# Patient Record
Sex: Female | Born: 1970 | Race: White | Hispanic: No | Marital: Married | Smoking: Current every day smoker
Health system: Southern US, Community
[De-identification: ages and names within clinical notes are randomized; demographics above are authoritative.]

## PROBLEM LIST (undated history)

## (undated) DIAGNOSIS — I10 Essential (primary) hypertension: Secondary | ICD-10-CM

---

## 2019-06-17 ENCOUNTER — Emergency Department (HOSPITAL_COMMUNITY): Payer: 59

## 2019-06-17 ENCOUNTER — Other Ambulatory Visit: Payer: Self-pay

## 2019-06-17 ENCOUNTER — Observation Stay (HOSPITAL_COMMUNITY)
Admission: EM | Admit: 2019-06-17 | Discharge: 2019-06-18 | Disposition: A | Discharge status: Home / Self Care | Payer: 59 | Attending: General Surgery | Admitting: General Surgery

## 2019-06-17 ENCOUNTER — Encounter (HOSPITAL_COMMUNITY): Payer: Self-pay | Admitting: *Deleted

## 2019-06-17 DIAGNOSIS — K8012 Calculus of gallbladder with acute and chronic cholecystitis without obstruction: Principal | ICD-10-CM | POA: Insufficient documentation

## 2019-06-17 DIAGNOSIS — R1011 Right upper quadrant pain: Secondary | ICD-10-CM

## 2019-06-17 DIAGNOSIS — E039 Hypothyroidism, unspecified: Secondary | ICD-10-CM | POA: Diagnosis not present

## 2019-06-17 DIAGNOSIS — K8020 Calculus of gallbladder without cholecystitis without obstruction: Secondary | ICD-10-CM | POA: Diagnosis present

## 2019-06-17 DIAGNOSIS — G4733 Obstructive sleep apnea (adult) (pediatric): Secondary | ICD-10-CM | POA: Diagnosis not present

## 2019-06-17 DIAGNOSIS — F17200 Nicotine dependence, unspecified, uncomplicated: Secondary | ICD-10-CM | POA: Diagnosis not present

## 2019-06-17 DIAGNOSIS — Z20822 Contact with and (suspected) exposure to covid-19: Secondary | ICD-10-CM | POA: Diagnosis not present

## 2019-06-17 DIAGNOSIS — E876 Hypokalemia: Secondary | ICD-10-CM | POA: Diagnosis not present

## 2019-06-17 DIAGNOSIS — I10 Essential (primary) hypertension: Secondary | ICD-10-CM | POA: Diagnosis not present

## 2019-06-17 HISTORY — DX: Essential (primary) hypertension: I10

## 2019-06-17 LAB — COMPREHENSIVE METABOLIC PANEL
ALT: 33 U/L (ref 0–44)
AST: 21 U/L (ref 15–41)
Albumin: 4.7 g/dL (ref 3.5–5.0)
Alkaline Phosphatase: 48 U/L (ref 38–126)
Anion gap: 16 — ABNORMAL HIGH (ref 5–15)
BUN: 15 mg/dL (ref 6–20)
CO2: 26 mmol/L (ref 22–32)
Calcium: 9.2 mg/dL (ref 8.9–10.3)
Chloride: 98 mmol/L (ref 98–111)
Creatinine, Ser: 0.84 mg/dL (ref 0.44–1.00)
GFR calc Af Amer: >60 mL/min (ref >60)
GFR calc non Af Amer: >60 mL/min (ref >60)
Glucose, Bld: 166 mg/dL — ABNORMAL HIGH (ref 70–99)
Potassium: 2.6 mmol/L — CL (ref 3.5–5.1)
Sodium: 140 mmol/L (ref 135–145)
Total Bilirubin: 0.5 mg/dL (ref 0.3–1.2)
Total Protein: 7.7 g/dL (ref 6.5–8.1)

## 2019-06-17 LAB — CBC WITH DIFFERENTIAL/PLATELET
Abs Immature Granulocytes: 0.03 10*3/uL (ref 0.00–0.07)
Basophils Absolute: 0.1 10*3/uL (ref 0.0–0.1)
Basophils Relative: 1 %
Eosinophils Absolute: 0.2 10*3/uL (ref 0.0–0.5)
Eosinophils Relative: 2 %
HCT: 44.7 % (ref 36.0–46.0)
Hemoglobin: 15 g/dL (ref 12.0–15.0)
Immature Granulocytes: 0 %
Lymphocytes Relative: 20 %
Lymphs Abs: 1.9 10*3/uL (ref 0.7–4.0)
MCH: 28.3 pg (ref 26.0–34.0)
MCHC: 33.6 g/dL (ref 30.0–36.0)
MCV: 84.3 fL (ref 80.0–100.0)
Monocytes Absolute: 0.5 10*3/uL (ref 0.1–1.0)
Monocytes Relative: 6 %
Neutro Abs: 7 10*3/uL (ref 1.7–7.7)
Neutrophils Relative %: 71 %
Platelets: 226 10*3/uL (ref 150–400)
RBC: 5.3 MIL/uL — ABNORMAL HIGH (ref 3.87–5.11)
RDW: 13.4 % (ref 11.5–15.5)
WBC: 9.8 10*3/uL (ref 4.0–10.5)
nRBC: 0 % (ref 0.0–0.2)

## 2019-06-17 LAB — URINALYSIS, ROUTINE W REFLEX MICROSCOPIC
Bilirubin Urine: NEGATIVE
Glucose, UA: NEGATIVE mg/dL
Hgb urine dipstick: NEGATIVE
Ketones, ur: NEGATIVE mg/dL
Leukocytes,Ua: NEGATIVE
Nitrite: NEGATIVE
Protein, ur: NEGATIVE mg/dL
Specific Gravity, Urine: 1.009 (ref 1.005–1.030)
pH: 8 (ref 5.0–8.0)

## 2019-06-17 LAB — PREGNANCY, URINE: Preg Test, Ur: NEGATIVE

## 2019-06-17 LAB — RESPIRATORY PANEL BY RT PCR (FLU A&B, COVID)
Influenza A by PCR: NEGATIVE
Influenza B by PCR: NEGATIVE
SARS Coronavirus 2 by RT PCR: NEGATIVE

## 2019-06-17 LAB — HIV ANTIBODY (ROUTINE TESTING W REFLEX): HIV Screen 4th Generation wRfx: NONREACTIVE

## 2019-06-17 LAB — MAGNESIUM: Magnesium: 2.1 mg/dL (ref 1.7–2.4)

## 2019-06-17 LAB — LIPASE, BLOOD: Lipase: 33 U/L (ref 11–51)

## 2019-06-17 MED ORDER — HYDROCHLOROTHIAZIDE 25 MG PO TABS: 25.0000 mg | ORAL_TABLET | Freq: Every day | ORAL | Status: DC

## 2019-06-17 MED ORDER — POTASSIUM CHLORIDE CRYS ER 20 MEQ PO TBCR
40.0000 meq | EXTENDED_RELEASE_TABLET | Freq: Two times a day (BID) | ORAL | Status: AC
  Administered 2019-06-17 (×2): 40 meq via ORAL
  Filled 2019-06-17 (×2): qty 2

## 2019-06-17 MED ORDER — POTASSIUM CHLORIDE 10 MEQ/100ML IV SOLN
10.0000 meq | Freq: Once | INTRAVENOUS | Status: AC
  Administered 2019-06-17: 10 meq via INTRAVENOUS
  Filled 2019-06-17: qty 100

## 2019-06-17 MED ORDER — HYDROMORPHONE HCL 1 MG/ML IJ SOLN
1.0000 mg | Freq: Once | INTRAMUSCULAR | Status: AC
  Administered 2019-06-17: 1 mg via INTRAVENOUS
  Filled 2019-06-17: qty 1

## 2019-06-17 MED ORDER — SODIUM CHLORIDE 0.9 % IV SOLN: INTRAVENOUS | Status: DC

## 2019-06-17 MED ORDER — OXYCODONE HCL 5 MG PO TABS: 5.0000 mg | ORAL_TABLET | ORAL | Status: DC | PRN

## 2019-06-17 MED ORDER — PANTOPRAZOLE SODIUM 40 MG IV SOLR
40.0000 mg | Freq: Every day | INTRAVENOUS | Status: DC
  Administered 2019-06-17 – 2019-06-18 (×2): 40 mg via INTRAVENOUS
  Filled 2019-06-17 (×2): qty 40

## 2019-06-17 MED ORDER — POTASSIUM CHLORIDE CRYS ER 20 MEQ PO TBCR: 40.0000 meq | EXTENDED_RELEASE_TABLET | Freq: Two times a day (BID) | ORAL | Status: DC

## 2019-06-17 MED ORDER — AMLODIPINE BESYLATE 5 MG PO TABS: 5.0000 mg | ORAL_TABLET | Freq: Every day | ORAL | Status: DC

## 2019-06-17 MED ORDER — ACETAMINOPHEN 325 MG PO TABS: 650.0000 mg | ORAL_TABLET | Freq: Four times a day (QID) | ORAL | Status: DC | PRN

## 2019-06-17 MED ORDER — FAMOTIDINE IN NACL 20-0.9 MG/50ML-% IV SOLN
20.0000 mg | Freq: Once | INTRAVENOUS | Status: AC
  Administered 2019-06-17: 20 mg via INTRAVENOUS
  Filled 2019-06-17: qty 50

## 2019-06-17 MED ORDER — MORPHINE SULFATE (PF) 2 MG/ML IV SOLN
2.0000 mg | INTRAVENOUS | Status: DC | PRN
  Administered 2019-06-17 – 2019-06-18 (×3): 2 mg via INTRAVENOUS
  Filled 2019-06-17 (×3): qty 1

## 2019-06-17 MED ORDER — DIPHENHYDRAMINE HCL 50 MG/ML IJ SOLN: 25.0000 mg | Freq: Four times a day (QID) | INTRAMUSCULAR | Status: DC | PRN

## 2019-06-17 MED ORDER — METOCLOPRAMIDE HCL 5 MG/ML IJ SOLN
10.0000 mg | Freq: Once | INTRAMUSCULAR | Status: AC
  Administered 2019-06-17: 10 mg via INTRAVENOUS
  Filled 2019-06-17: qty 2

## 2019-06-17 MED ORDER — SERTRALINE HCL 50 MG PO TABS
50.0000 mg | ORAL_TABLET | Freq: Every day | ORAL | Status: DC
  Administered 2019-06-17: 50 mg via ORAL
  Filled 2019-06-17 (×2): qty 1

## 2019-06-17 MED ORDER — ONDANSETRON HCL 4 MG/2ML IJ SOLN
4.0000 mg | Freq: Once | INTRAMUSCULAR | Status: AC
  Administered 2019-06-17: 4 mg via INTRAVENOUS
  Filled 2019-06-17: qty 2

## 2019-06-17 MED ORDER — ONDANSETRON 4 MG PO TBDP: 4.0000 mg | ORAL_TABLET | Freq: Four times a day (QID) | ORAL | Status: DC | PRN

## 2019-06-17 MED ORDER — CIPROFLOXACIN IN D5W 400 MG/200ML IV SOLN
400.0000 mg | Freq: Two times a day (BID) | INTRAVENOUS | Status: DC
  Administered 2019-06-17 – 2019-06-18 (×3): 400 mg via INTRAVENOUS
  Filled 2019-06-17 (×5): qty 200

## 2019-06-17 MED ORDER — METHOCARBAMOL 500 MG PO TABS: 500.0000 mg | ORAL_TABLET | Freq: Four times a day (QID) | ORAL | Status: DC | PRN

## 2019-06-17 MED ORDER — METOPROLOL TARTRATE 5 MG/5ML IV SOLN: 5.0000 mg | Freq: Four times a day (QID) | INTRAVENOUS | Status: DC | PRN

## 2019-06-17 MED ORDER — ONDANSETRON HCL 4 MG/2ML IJ SOLN
4.0000 mg | Freq: Four times a day (QID) | INTRAMUSCULAR | Status: DC | PRN
  Administered 2019-06-17 (×2): 4 mg via INTRAVENOUS
  Filled 2019-06-17 (×2): qty 2

## 2019-06-17 MED ORDER — ACETAMINOPHEN 650 MG RE SUPP: 650.0000 mg | Freq: Four times a day (QID) | RECTAL | Status: DC | PRN

## 2019-06-17 MED ORDER — DIPHENHYDRAMINE HCL 25 MG PO CAPS: 25.0000 mg | ORAL_CAPSULE | Freq: Four times a day (QID) | ORAL | Status: DC | PRN

## 2019-06-17 MED ORDER — LISINOPRIL-HYDROCHLOROTHIAZIDE 20-12.5 MG PO TABS: 2.0000 | ORAL_TABLET | Freq: Every day | ORAL | Status: DC

## 2019-06-17 MED ORDER — LEVOTHYROXINE SODIUM 25 MCG PO TABS
125.0000 ug | ORAL_TABLET | Freq: Every day | ORAL | Status: DC
  Administered 2019-06-18: 125 ug via ORAL
  Filled 2019-06-17: qty 1

## 2019-06-17 MED ORDER — HYDROMORPHONE HCL 1 MG/ML IJ SOLN
0.5000 mg | Freq: Once | INTRAMUSCULAR | Status: AC | PRN
  Administered 2019-06-17: 0.5 mg via INTRAVENOUS
  Filled 2019-06-17: qty 1

## 2019-06-17 MED ORDER — ENOXAPARIN SODIUM 40 MG/0.4ML ~~LOC~~ SOLN
40.0000 mg | Freq: Every day | SUBCUTANEOUS | Status: DC
  Administered 2019-06-17: 40 mg via SUBCUTANEOUS
  Filled 2019-06-17: qty 0.4

## 2019-06-17 MED ORDER — METOPROLOL SUCCINATE ER 25 MG PO TB24
25.0000 mg | ORAL_TABLET | Freq: Every day | ORAL | Status: DC
  Administered 2019-06-18: 25 mg via ORAL
  Filled 2019-06-17: qty 1

## 2019-06-17 MED ORDER — LISINOPRIL 20 MG PO TABS: 40.0000 mg | ORAL_TABLET | Freq: Every day | ORAL | Status: DC

## 2019-06-17 NOTE — ED Notes (Signed)
Patient back from [facility].

## 2019-06-17 NOTE — ED Notes (Addendum)
Patient transported to [facility] 

## 2019-06-17 NOTE — ED Notes (Signed)
Urine culture sent down with u/a 

## 2019-06-17 NOTE — ED Provider Notes (Signed)
[name] Provider Note   CSN: [id number] Arrival date & time: 06/17/19  0259     History Chief Complaint  Patient presents with  . Abdominal Pain    Rebekah Stanton is a 49 y.o. female with a history of HTN, hypothyroidism, and lichen planus who presents to the emergency department with a chief complaint of abdominal pain.  The patient endorses epigastric pain that radiates to her mid back that began suddenly at 22:00.  She characterizes the pain as "burning" and feeling as if she is being burned with a hot poker.  She reports associated nausea and diarrhea that began along with the pain.  She has an increased burping and belching and feeling bloated.  She has had 2 previous episodes of similar pain, last episode was 1 week ago.  She reports that both episodes began at night.  There is no known aggravating or alleviating factors.  No treatment prior to arrival.  No vomiting, fever, chills, vomiting, constipation, dysuria, vaginal pain, bleeding, or discharge, flank pain, cough, shortness of breath, chest pain, or sore throat.  No melena or hematochezia.  She reports infrequent, social alcohol use.  Denies recent alcohol use or increase in alcohol use at home.  No illicit or recreational substance use.  Reports that she takes NSAIDs very infrequently, but did take a dose this morning.  Surgical history includes hysterectomy for endometriosis.  No history of peptic ulcer disease.  The history is provided by the patient. No language interpreter was used.       Past Medical History:  Diagnosis Date  . Hypertension     Patient Active Problem List   Diagnosis Date Noted  . Symptomatic cholelithiasis 06/17/2019    History reviewed. No pertinent surgical history.   OB History   No obstetric history on file.     No family history on file.  Social History   Tobacco Use  . Smoking status: Current Every Day Smoker  . Smokeless tobacco: Never  Used  Substance Use Topics  . Alcohol use: Not on file  . Drug use: Not on file    Home Medications Prior to Admission medications   Medication Sig Start Date End Date Taking? Authorizing Provider  amLODipine (NORVASC) 5 MG tablet Take 5 mg by mouth daily. 05/16/19  Yes [provider]  clobetasol (OLUX) 0.05 % topical foam Apply 1 application topically daily. 06/12/19  Yes [provider]  ibuprofen (ADVIL) 200 MG tablet Take 400 mg by mouth daily as needed for fever or moderate pain.   Yes [provider]  levothyroxine (SYNTHROID) 125 MCG tablet Take 125 mcg by mouth daily. 06/09/19  Yes [provider]  lisinopril-hydrochlorothiazide (ZESTORETIC) 20-12.5 MG tablet Take 2 tablets by mouth daily. 06/09/19  Yes [provider]  metoprolol succinate (TOPROL-XL) 25 MG 24 hr tablet Take 25 mg by mouth daily. 05/28/19  Yes [provider]  sertraline (ZOLOFT) 50 MG tablet Take 50 mg by mouth daily. 06/09/19  Yes [provider]    Allergies    Penicillins  Review of Systems   Review of Systems  Constitutional: Negative for activity change, chills and fever.  Respiratory: Negative for shortness of breath and wheezing.   Cardiovascular: Negative for chest pain, palpitations and leg swelling.  Gastrointestinal: Positive for abdominal pain, diarrhea and nausea. Negative for anal bleeding, blood in stool and vomiting.       +Bleching +Bloating   Genitourinary: Negative for dysuria, flank  pain, frequency and urgency.  Musculoskeletal: Negative for back pain, myalgias, neck pain and neck stiffness.  Skin: Negative for rash.  Allergic/Immunologic: Negative for immunocompromised state.  Neurological: Negative for seizures, syncope, weakness, numbness and headaches.  Psychiatric/Behavioral: Negative for confusion.    Physical Exam Updated Vital Signs BP 122/77   Pulse (!) 52   Temp 97.7 F (36.5 C)   Resp 18   Ht 5' 3"  (1.6 m)   Wt  86.2 kg   SpO2 91%   BMI 33.66 kg/m   Physical Exam Vitals and nursing note reviewed.  Constitutional:      General: She is not in acute distress.    Appearance: She is not ill-appearing or diaphoretic.     Comments: Nontoxic-appearing.  HENT:     Head: Normocephalic.  Eyes:     Conjunctiva/sclera: Conjunctivae normal.  Cardiovascular:     Rate and Rhythm: Normal rate and regular rhythm.     Heart sounds: No murmur. No friction rub. No gallop.   Pulmonary:     Effort: Pulmonary effort is normal. No respiratory distress.     Breath sounds: No stridor. No wheezing, rhonchi or rales.  Chest:     Chest wall: No tenderness.  Abdominal:     General: There is no distension.     Palpations: Abdomen is soft. There is no mass.     Tenderness: There is abdominal tenderness. There is no right CVA tenderness, left CVA tenderness, guarding or rebound.     Hernia: No hernia is present.     Comments: Tender to palpation in the epigastric and right upper quadrant.  She has a positive Murphy sign.  Abdomen is soft and nondistended.  Hyperactive bowel sounds in all 4 quadrants.  No tenderness over McBurney's point.  No CVA tenderness bilaterally.  There is voluntary guarding, but no rebound.  Musculoskeletal:     Cervical back: Neck supple.     Right lower leg: No edema.     Left lower leg: No edema.  Skin:    General: Skin is warm.     Findings: No rash.  Neurological:     Mental Status: She is alert.  Psychiatric:        Behavior: Behavior normal.     ED Results / Procedures / Treatments   Labs (all labs ordered are listed, but only abnormal results are displayed) Labs Reviewed  CBC WITH DIFFERENTIAL/PLATELET - Abnormal; Notable for the following components:      Result Value   RBC 5.30 (*)    All other components within normal limits  COMPREHENSIVE METABOLIC PANEL - Abnormal; Notable for the following components:   Potassium 2.6 (*)    Glucose, Bld 166 (*)    Anion gap 16 (*)     All other components within normal limits  URINALYSIS, ROUTINE W REFLEX MICROSCOPIC - Abnormal; Notable for the following components:   APPearance CLOUDY (*)    All other components within normal limits  RESPIRATORY PANEL BY RT PCR (FLU A&B, COVID)  LIPASE, BLOOD  PREGNANCY, URINE  MAGNESIUM  HIV ANTIBODY (ROUTINE TESTING W REFLEX)    EKG EKG Interpretation  Date/Time:  Sunday Jun 17 2019 05:19:14 EDT Ventricular Rate:  52 PR Interval:    QRS Duration: 99 QT Interval:  475 QTC Calculation: 442 R Axis:   26 Text Interpretation: Sinus rhythm No old tracing to compare Confirmed by Malvin Johns [phone]) on 06/17/2019 7:24:52 AM   Radiology US Abdomen Limited RUQ  Result Date: 06/17/2019 CLINICAL DATA:  RIGHT lower quadrant pain and abdominal pain EXAM: ULTRASOUND ABDOMEN LIMITED RIGHT UPPER QUADRANT COMPARISON:  None. FINDINGS: Gallbladder: Gallbladder is nondistended. No gallbladder wall thickening or pericholecystic fluid. Negative sonographic Murphy's sign. There is a calculus with towards the neck of the gallbladder measuring 1.4 cm. Common bile duct: Diameter: Upper limits of normal at 6 mm. Liver: Increased hepatic echogenicity. Portal vein is patent on color Doppler imaging with normal direction of blood flow towards the liver. Other: None. IMPRESSION: 1. Large gallstone towards the neck of the gallbladder without evidence acute cholecystitis. 2. Common bile duct upper limits of normal. 3. Heterogeneous increased liver echogenicity commonly represents hepatic steatosis. Electronically Signed   By: Suzy Bouchard M.D.   On: 06/17/2019 07:02    Procedures Procedures (including critical care time)  Medications Ordered in ED Medications  amLODipine (NORVASC) tablet 5 mg (has no administration in time range)  metoprolol succinate (TOPROL-XL) 24 hr tablet 25 mg (has no administration in time range)  sertraline (ZOLOFT) tablet 50 mg (has no administration in time range)   levothyroxine (SYNTHROID) tablet 125 mcg (has no administration in time range)  enoxaparin (LOVENOX) injection 40 mg (has no administration in time range)  0.9 %  sodium chloride infusion (has no administration in time range)  ciprofloxacin (CIPRO) IVPB 400 mg (has no administration in time range)  acetaminophen (TYLENOL) tablet 650 mg (has no administration in time range)    Or  acetaminophen (TYLENOL) suppository 650 mg (has no administration in time range)  oxyCODONE (Oxy IR/ROXICODONE) immediate release tablet 5-10 mg (has no administration in time range)  morphine 2 MG/ML injection 2 mg (has no administration in time range)  methocarbamol (ROBAXIN) tablet 500 mg (has no administration in time range)  diphenhydrAMINE (BENADRYL) capsule 25 mg (has no administration in time range)    Or  diphenhydrAMINE (BENADRYL) injection 25 mg (has no administration in time range)  ondansetron (ZOFRAN-ODT) disintegrating tablet 4 mg (has no administration in time range)    Or  ondansetron (ZOFRAN) injection 4 mg (has no administration in time range)  pantoprazole (PROTONIX) injection 40 mg (has no administration in time range)  metoprolol tartrate (LOPRESSOR) injection 5 mg (has no administration in time range)  lisinopril (ZESTRIL) tablet 40 mg (has no administration in time range)    And  hydrochlorothiazide (HYDRODIURIL) tablet 25 mg (has no administration in time range)  potassium chloride SA (KLOR-CON) CR tablet 40 mEq (has no administration in time range)  ondansetron (ZOFRAN) injection 4 mg (4 mg Intravenous Given 06/17/19 0444)  famotidine (PEPCID) IVPB 20 mg premix (0 mg Intravenous Stopped 06/17/19 0545)  potassium chloride 10 mEq in 100 mL IVPB ( Intravenous Stopped 06/17/19 0555)  HYDROmorphone (DILAUDID) injection 0.5 mg (0.5 mg Intravenous Given 06/17/19 0453)  HYDROmorphone (DILAUDID) injection 1 mg (1 mg Intravenous Given 06/17/19 0722)  metoCLOPramide (REGLAN) injection 10 mg (10 mg  Intravenous Given 06/17/19 2542)    ED Course  I have reviewed the triage vital signs and the nursing notes.  Pertinent labs & imaging results that were available during my care of the patient were reviewed by me and considered in my medical decision making (see chart for details).  Clinical Course as of Jun 17 903  Sun Jun 17, 2019  0710 Call surgery   [MV]    Clinical Course User Index [MV] Eustaquio Maize, PA-C   MDM Rules/Calculators/A&P  49 year old female with a history of HTN, hypothyroidism, and lichen planus presenting with upper abdominal pain, nausea, and diarrhea, onset tonight.  Pain is radiating to her back and characterizes burning.  This is her third episode of similar pain.  No constitutional symptoms.  Vital signs are reassuring in the ER.  Differential diagnosis includes peptic ulcer disease, cholecystitis, choledocholithiasis, pancreatitis, gastritis.  On exam, patient is medically tender in the epigastric region and right upper quadrant.  She has a positive Murphy sign.  She appears uncomfortable, but is nontoxic-appearing.  Zofran, Pepcid, and Dilaudid ordered.  No leukocytosis on labs.  No transaminitis.  Total bilirubin, lipase, and alk phos are normal.  Hypokalemic to 2.6.  Will give IV potassium x2 since patient is still n.p.o. while awaiting right upper quadrant ultrasound.  Patient is on lisinopril-HCTZ.  Suspect hypokalemia may be secondary to medication as patient has had no vomiting.  However, on reevaluation, patient appears considerably uncomfortable.  Right upper quadrant ultrasound with large gallstone towards the neck of the gallbladder without evidence of acute cholecystitis.  Common bile duct is upper limits of normal.  There is hepatic steatosis.  On reevaluation, patient is still very uncomfortable and very nauseated.  Will reorder pain medication and add Reglan.  She will require general surgery consult for continued symptomatic  biliary colic.  Patient care transferred to [facility] at the end of my shift. Patient presentation, ED course, and plan of care discussed with review of all pertinent labs and imaging. Please see his/her note for further details regarding further ED course and disposition.   Final Clinical Impression(s) / ED Diagnoses Final diagnoses:  Right upper quadrant abdominal pain    Rx / DC Orders ED Discharge Orders    None       Satoru Milich A, PA-C 06/17/19 0905    Ezequiel Essex, MD 06/17/19 [phone]

## 2019-06-17 NOTE — ED Provider Notes (Signed)
Care assumed from [facility], [place], at shift change, please see their notes for full documentation of patient's complaint/HPI. Briefly, pt here with epigastric abdominal pain that began last night, similar episodes last week. Results so far show hypokalemia 2.6, 2 rounds of IV potassium given. Ultrasound with gallstone in the neck of the gallbladder however no acute cholecystitis appreciated. Plan is to consult general surgery given intractable pain.   Physical Exam  BP 122/77   Pulse (!) 52   Temp 97.7 F (36.5 C)   Resp 18   Ht 5\' 3"  (1.6 m)   Wt 86.2 kg   SpO2 91%   BMI 33.66 kg/m   Physical Exam  ED Course/Procedures   Clinical Course as of Jun 16 720  Sun Jun 17, 2019  0710 Call surgery   [MV]    Clinical Course User Index [MV] Eustaquio Maize, PA-C    Procedures  Results for orders placed or performed during the hospital encounter of 06/17/19  CBC with Differential/Platelet  Result Value Ref Range   WBC 9.8 4.0 - 10.5 K/uL   RBC 5.30 (H) 3.87 - 5.11 MIL/uL   Hemoglobin 15.0 12.0 - 15.0 g/dL   HCT 44.7 36.0 - 46.0 %   MCV 84.3 80.0 - 100.0 fL   MCH 28.3 26.0 - 34.0 pg   MCHC 33.6 30.0 - 36.0 g/dL   RDW 13.4 11.5 - 15.5 %   Platelets 226 150 - 400 K/uL   nRBC 0.0 0.0 - 0.2 %   Neutrophils Relative % 71 %   Neutro Abs 7.0 1.7 - 7.7 K/uL   Lymphocytes Relative 20 %   Lymphs Abs 1.9 0.7 - 4.0 K/uL   Monocytes Relative 6 %   Monocytes Absolute 0.5 0.1 - 1.0 K/uL   Eosinophils Relative 2 %   Eosinophils Absolute 0.2 0.0 - 0.5 K/uL   Basophils Relative 1 %   Basophils Absolute 0.1 0.0 - 0.1 K/uL   Immature Granulocytes 0 %   Abs Immature Granulocytes 0.03 0.00 - 0.07 K/uL  Comprehensive metabolic panel  Result Value Ref Range   Sodium 140 135 - 145 mmol/L   Potassium 2.6 (LL) 3.5 - 5.1 mmol/L   Chloride 98 98 - 111 mmol/L   CO2 26 22 - 32 mmol/L   Glucose, Bld 166 (H) 70 - 99 mg/dL   BUN 15 6 - 20 mg/dL   Creatinine, Ser 0.84 0.44 - 1.00 mg/dL   Calcium  9.2 8.9 - 10.3 mg/dL   Total Protein 7.7 6.5 - 8.1 g/dL   Albumin 4.7 3.5 - 5.0 g/dL   AST 21 15 - 41 U/L   ALT 33 0 - 44 U/L   Alkaline Phosphatase 48 38 - 126 U/L   Total Bilirubin 0.5 0.3 - 1.2 mg/dL   GFR calc non Af Amer >60 >60 mL/min   GFR calc Af Amer >60 >60 mL/min   Anion gap 16 (H) 5 - 15  Lipase, blood  Result Value Ref Range   Lipase 33 11 - 51 U/L  Urinalysis, Routine w reflex microscopic  Result Value Ref Range   Color, Urine YELLOW YELLOW   APPearance CLOUDY (A) CLEAR   Specific Gravity, Urine 1.009 1.005 - 1.030   pH 8.0 5.0 - 8.0   Glucose, UA NEGATIVE NEGATIVE mg/dL   Hgb urine dipstick NEGATIVE NEGATIVE   Bilirubin Urine NEGATIVE NEGATIVE   Ketones, ur NEGATIVE NEGATIVE mg/dL   Protein, ur NEGATIVE NEGATIVE mg/dL   Nitrite  NEGATIVE NEGATIVE   Leukocytes,Ua NEGATIVE NEGATIVE  Pregnancy, urine  Result Value Ref Range   Preg Test, Ur NEGATIVE NEGATIVE  Magnesium  Result Value Ref Range   Magnesium 2.1 1.7 - 2.4 mg/dL   US Abdomen Limited RUQ  Result Date: 06/17/2019 CLINICAL DATA:  RIGHT lower quadrant pain and abdominal pain EXAM: ULTRASOUND ABDOMEN LIMITED RIGHT UPPER QUADRANT COMPARISON:  None. FINDINGS: Gallbladder: Gallbladder is nondistended. No gallbladder wall thickening or pericholecystic fluid. Negative sonographic Murphy's sign. There is a calculus with towards the neck of the gallbladder measuring 1.4 cm. Common bile duct: Diameter: Upper limits of normal at 6 mm. Liver: Increased hepatic echogenicity. Portal vein is patent on color Doppler imaging with normal direction of blood flow towards the liver. Other: None. IMPRESSION: 1. Large gallstone towards the neck of the gallbladder without evidence acute cholecystitis. 2. Common bile duct upper limits of normal. 3. Heterogeneous increased liver echogenicity commonly represents hepatic steatosis. Electronically Signed   By: Genevive Bi M.D.   On: 06/17/2019 07:02    MDM  Discussed case with  Leary Roca PA-C with general surgery who will come evaluate patient  Pt admitted to surgery service for concern for early acute cholecystitis.      Tanda Rockers, PA-C 06/17/19 0626    Rolan Bucco, MD 06/17/19 [phone]

## 2019-06-17 NOTE — ED Notes (Signed)
Date and time results received: 06/17/19 Test:KCL  Critical Value: 2.6 Name of Provider Notified:Mia McDonald

## 2019-06-17 NOTE — ED Provider Notes (Signed)
ED ECG REPORT   Date: 06/17/2019  Rate: 52  Rhythm: normal sinus rhythm  QRS Axis: normal  Intervals: normal  ST/T Wave abnormalities: normal  Conduction Disutrbances:none  Narrative Interpretation:   Old EKG Reviewed: unchanged  I have personally reviewed the EKG tracing and agree with the computerized printout as noted.    Glynn Octave, MD 06/17/19 [phone]

## 2019-06-17 NOTE — ED Triage Notes (Signed)
Pt c/o epigastric pain with nausea and vomiting since 2300  She has had this several times  And she still has her gallbladder  Chills    lmp none

## 2019-06-17 NOTE — Plan of Care (Signed)
  Problem: Education: Goal: Knowledge of General Education information will improve Description Including pain rating scale, medication(s)/side effects and non-pharmacologic comfort measures Outcome: Progressing   

## 2019-06-17 NOTE — H&P (Addendum)
Rebekah Stanton [birth year]  [id number].    Requesting MD: Eustaquio Maize, PA-C (attending: Dr. Tamera Punt) Chief Complaint/Reason for Consult: RUQ abdominal pain, cholelithiasis  HPI: Rebekah Stanton is a 49 y.o. female with a hx of OSA, HTN and hypothyroidism who presented to the emergency room for epigastric abdominal pain with nausea.  Patient reports that around 10 PM last night after finishing a "Mother's Day cookout" she began having severe, 10/10, burning epigastric abdominal pain that radiated to her mid back.  She reports her symptoms were associated with nausea as well as 1 episode of diarrhea.  No associated fever, chills, emesis, chest pain or shortness of breath.  She did not try anything for this at home.  She notes 2 similar episodes in the past 1 week that onset after eating foods such as BBQ. Her previous episodes were self limiting and lasted for <1 hour.   She presented to the emergency room for evaluation.  She is known to be afebrile without tachycardia, tachypnea, hypoxia or hypotension.  WBC 9.8.  LFTs within normal limits. RUQ US showed large gallstone at neck of the gallbladder measuring 1.4cm. There was no gallbladder wall thickening or pericholecystic fluid.  Despite IV pain medication, patient continued to have pain.  We were asked to evaluate.    Patient is not on blood thinners.  She notes that she has had 2 C-sections, abdominal hysterectomy as well as a tummy tuck.  She reports occasional alcohol use.  No tobacco use.  No illicit drug use.  She is a Chief Technology Officer.  ROS: Review of Systems  Constitutional: Negative for chills and fever.  Respiratory: Negative for cough and shortness of breath.   Cardiovascular: Negative for chest pain and leg swelling.  Gastrointestinal: Positive for abdominal pain, diarrhea and nausea. Negative for constipation and vomiting.  Genitourinary: Negative for dysuria and urgency.  Psychiatric/Behavioral: Negative for substance  abuse. Suicidal ideas:    All other systems reviewed and are negative.   No family history on file.  Past Medical History:  Diagnosis Date  . Hypertension     History reviewed. No pertinent surgical history.  Social History:  reports that she has been smoking. She has never used smokeless tobacco. No history on file for alcohol and drug.  Allergies:  Allergies  Allergen Reactions  . Penicillins Hives    (Not in a hospital admission)    Physical Exam: Blood pressure 122/77, pulse (!) 52, temperature 97.7 F (36.5 C), resp. rate 18, height 5\' 3"  (1.6 m), weight 86.2 kg, SpO2 91 %. General: pleasant, WD/WN white female who is laying in bed in NAD HEENT: head is normocephalic, atraumatic.  Sclera are noninjected.  PERRL.  Ears and nose without any masses or lesions.  Mouth is pink and moist. Dentition fair Heart: regular, rate, and rhythm.  Normal s1,s2. No obvious murmurs, gallops, or rubs noted.  Palpable pedal pulses bilaterally  Lungs: CTAB, no wheezes, rhonchi, or rales noted.  Respiratory effort nonlabored Abd: Soft, ND, tenderness of the RUQ>Epigastrium without r/r/g. +BS, no masses, hernias, or organomegaly.  MS: no BUE/BLE edema, calves soft and nontender Skin: warm and dry with no masses, lesions, or rashes Psych: A&Ox4 with an appropriate affect Neuro: cranial nerves grossly intact, equal strength in BUE/BLE bilaterally, normal speech, though process intact   Results for orders placed or performed during the hospital encounter of 06/17/19 (from the past 48 hour(s))  Urinalysis, Routine w reflex microscopic     Status: Abnormal  Collection Time: 06/17/19  3:17 AM  Result Value Ref Range   Color, Urine YELLOW YELLOW   APPearance CLOUDY (A) CLEAR   Specific Gravity, Urine 1.009 1.005 - 1.030   pH 8.0 5.0 - 8.0   Glucose, UA NEGATIVE NEGATIVE mg/dL   Hgb urine dipstick NEGATIVE NEGATIVE   Bilirubin Urine NEGATIVE NEGATIVE   Ketones, ur NEGATIVE NEGATIVE mg/dL    Protein, ur NEGATIVE NEGATIVE mg/dL   Nitrite NEGATIVE NEGATIVE   Leukocytes,Ua NEGATIVE NEGATIVE    Comment: Performed at [facility], [address], [place], [place] [address]  Pregnancy, urine     Status: None   Collection Time: 06/17/19  3:17 AM  Result Value Ref Range   Preg Test, Ur NEGATIVE NEGATIVE    Comment:        THE SENSITIVITY OF THIS METHODOLOGY IS >20 mIU/mL. Performed at [facility], [address] [place], [place] [address]   CBC with Differential/Platelet     Status: Abnormal   Collection Time: 06/17/19  3:33 AM  Result Value Ref Range   WBC 9.8 4.0 - 10.5 K/uL   RBC 5.30 (H) 3.87 - 5.11 MIL/uL   Hemoglobin 15.0 12.0 - 15.0 g/dL   HCT 99.2 42.6 - 83.4 %   MCV 84.3 80.0 - 100.0 fL   MCH 28.3 26.0 - 34.0 pg   MCHC 33.6 30.0 - 36.0 g/dL   RDW 19.6 22.2 - 97.9 %   Platelets 226 150 - 400 K/uL   nRBC 0.0 0.0 - 0.2 %   Neutrophils Relative % 71 %   Neutro Abs 7.0 1.7 - 7.7 K/uL   Lymphocytes Relative 20 %   Lymphs Abs 1.9 0.7 - 4.0 K/uL   Monocytes Relative 6 %   Monocytes Absolute 0.5 0.1 - 1.0 K/uL   Eosinophils Relative 2 %   Eosinophils Absolute 0.2 0.0 - 0.5 K/uL   Basophils Relative 1 %   Basophils Absolute 0.1 0.0 - 0.1 K/uL   Immature Granulocytes 0 %   Abs Immature Granulocytes 0.03 0.00 - 0.07 K/uL    Comment: Performed at [facility], [address], [place], [place] [address]  Comprehensive metabolic panel     Status: Abnormal   Collection Time: 06/17/19  3:33 AM  Result Value Ref Range   Sodium 140 135 - 145 mmol/L   Potassium 2.6 (LL) 3.5 - 5.1 mmol/L    Comment: CRITICAL RESULT CALLED TO, READ BACK BY AND VERIFIED WITH: LEBRON Y,RN 06/17/19 0440 WAYK    Chloride 98 98 - 111 mmol/L   CO2 26 22 - 32 mmol/L   Glucose, Bld 166 (H) 70 - 99 mg/dL    Comment: Glucose reference range applies only to samples taken after fasting for at least 8 hours.   BUN 15 6 - 20 mg/dL   Creatinine, Ser 9.41 0.44 - 1.00 mg/dL   Calcium  9.2 8.9 - 74.0 mg/dL   Total Protein 7.7 6.5 - 8.1 g/dL   Albumin 4.7 3.5 - 5.0 g/dL   AST 21 15 - 41 U/L   ALT 33 0 - 44 U/L   Alkaline Phosphatase 48 38 - 126 U/L   Total Bilirubin 0.5 0.3 - 1.2 mg/dL   GFR calc non Af Amer >60 >60 mL/min   GFR calc Af Amer >60 >60 mL/min   Anion gap 16 (H) 5 - 15    Comment: Performed at [facility], [address]  [place], [place] [address]  Lipase, blood     Status: None   Collection Time: 06/17/19  3:33 AM  Result Value Ref Range   Lipase 33 11 - 51 U/L    Comment: Performed at [facility], [address], [place], [place] [address]  Magnesium     Status: None   Collection Time: 06/17/19  3:33 AM  Result Value Ref Range   Magnesium 2.1 1.7 - 2.4 mg/dL    Comment: Performed at [facility], [address] [place], [place] [address]   US Abdomen Limited RUQ  Result Date: 06/17/2019 CLINICAL DATA:  RIGHT lower quadrant pain and abdominal pain EXAM: ULTRASOUND ABDOMEN LIMITED RIGHT UPPER QUADRANT COMPARISON:  None. FINDINGS: Gallbladder: Gallbladder is nondistended. No gallbladder wall thickening or pericholecystic fluid. Negative sonographic Murphy's sign. There is a calculus with towards the neck of the gallbladder measuring 1.4 cm. Common bile duct: Diameter: Upper limits of normal at 6 mm. Liver: Increased hepatic echogenicity. Portal vein is patent on color Doppler imaging with normal direction of blood flow towards the liver. Other: None. IMPRESSION: 1. Large gallstone towards the neck of the gallbladder without evidence acute cholecystitis. 2. Common bile duct upper limits of normal. 3. Heterogeneous increased liver echogenicity commonly represents hepatic steatosis. Electronically Signed   By: Genevive Bi M.D.   On: 06/17/2019 07:02   Anti-infectives (From admission, onward)   None       Assessment/Plan HTN Hypothyroidism OSA Hypokalemia - Received IV in ED. Replace orally. Mg wnl.   Symptomatic cholelithiasis  with possible early cholecystitis -Will plan to admit to outpatient observation -Cover with IV antibiotics for possible early cholecystitis -N.p.o., IV fluids -Repeat labs in the a.m. -Will plan for laparoscopic cholecystectomy in the a.m.  FEN - NPO, IVF VTE - SCDs, Lovenox ID - Cipro (PCN allergy)  Jacinto Halim, [facility] 06/17/2019, 7:59 AM Please see Amion for pager number during day hours 7:00am-4:30pm

## 2019-06-18 ENCOUNTER — Observation Stay (HOSPITAL_COMMUNITY): Payer: 59 | Admitting: Certified Registered Nurse Anesthetist

## 2019-06-18 ENCOUNTER — Encounter (HOSPITAL_COMMUNITY): Payer: Self-pay

## 2019-06-18 ENCOUNTER — Encounter (HOSPITAL_COMMUNITY)
Admission: EM | Disposition: A | Discharge status: Home / Self Care | Payer: Self-pay | Source: Home / Self Care | Attending: Emergency Medicine

## 2019-06-18 HISTORY — PX: CHOLECYSTECTOMY: SHX55

## 2019-06-18 LAB — COMPREHENSIVE METABOLIC PANEL
ALT: 24 U/L (ref 0–44)
AST: 12 U/L — ABNORMAL LOW (ref 15–41)
Albumin: 3.3 g/dL — ABNORMAL LOW (ref 3.5–5.0)
Alkaline Phosphatase: 41 U/L (ref 38–126)
Anion gap: 8 (ref 5–15)
BUN: 6 mg/dL (ref 6–20)
CO2: 29 mmol/L (ref 22–32)
Calcium: 8.2 mg/dL — ABNORMAL LOW (ref 8.9–10.3)
Chloride: 106 mmol/L (ref 98–111)
Creatinine, Ser: 0.86 mg/dL (ref 0.44–1.00)
GFR calc Af Amer: >60 mL/min (ref >60)
GFR calc non Af Amer: >60 mL/min (ref >60)
Glucose, Bld: 120 mg/dL — ABNORMAL HIGH (ref 70–99)
Potassium: 2.8 mmol/L — ABNORMAL LOW (ref 3.5–5.1)
Sodium: 143 mmol/L (ref 135–145)
Total Bilirubin: 0.8 mg/dL (ref 0.3–1.2)
Total Protein: 5.8 g/dL — ABNORMAL LOW (ref 6.5–8.1)

## 2019-06-18 LAB — CBC
HCT: 38.4 % (ref 36.0–46.0)
Hemoglobin: 13 g/dL (ref 12.0–15.0)
MCH: 28.7 pg (ref 26.0–34.0)
MCHC: 33.9 g/dL (ref 30.0–36.0)
MCV: 84.8 fL (ref 80.0–100.0)
Platelets: 231 10*3/uL (ref 150–400)
RBC: 4.53 MIL/uL (ref 3.87–5.11)
RDW: 13.5 % (ref 11.5–15.5)
WBC: 7.1 10*3/uL (ref 4.0–10.5)
nRBC: 0 % (ref 0.0–0.2)

## 2019-06-18 LAB — LIPASE, BLOOD: Lipase: 29 U/L (ref 11–51)

## 2019-06-18 LAB — SURGICAL PCR SCREEN
MRSA, PCR: NEGATIVE
Staphylococcus aureus: NEGATIVE

## 2019-06-18 SURGERY — LAPAROSCOPIC CHOLECYSTECTOMY
Anesthesia: General | Site: Abdomen

## 2019-06-18 MED ORDER — MIDAZOLAM HCL 2 MG/2ML IJ SOLN
INTRAMUSCULAR | Status: AC
  Filled 2019-06-18: qty 2

## 2019-06-18 MED ORDER — DEXAMETHASONE SODIUM PHOSPHATE 10 MG/ML IJ SOLN
INTRAMUSCULAR | Status: DC | PRN
  Administered 2019-06-18: 8 mg via INTRAVENOUS

## 2019-06-18 MED ORDER — ONDANSETRON HCL 4 MG/2ML IJ SOLN
INTRAMUSCULAR | Status: DC | PRN
  Administered 2019-06-18: 4 mg via INTRAVENOUS

## 2019-06-18 MED ORDER — FENTANYL CITRATE (PF) 100 MCG/2ML IJ SOLN
INTRAMUSCULAR | Status: DC | PRN
  Administered 2019-06-18 (×3): 50 ug via INTRAVENOUS
  Administered 2019-06-18: 100 ug via INTRAVENOUS

## 2019-06-18 MED ORDER — POTASSIUM CHLORIDE 10 MEQ/100ML IV SOLN
INTRAVENOUS | Status: DC | PRN
  Administered 2019-06-18 (×2): 10 meq via INTRAVENOUS

## 2019-06-18 MED ORDER — FENTANYL CITRATE (PF) 100 MCG/2ML IJ SOLN
INTRAMUSCULAR | Status: AC
  Filled 2019-06-18: qty 2

## 2019-06-18 MED ORDER — BUPIVACAINE HCL (PF) 0.25 % IJ SOLN
INTRAMUSCULAR | Status: AC
  Filled 2019-06-18: qty 30

## 2019-06-18 MED ORDER — 0.9 % SODIUM CHLORIDE (POUR BTL) OPTIME
TOPICAL | Status: DC | PRN
  Administered 2019-06-18: 1000 mL

## 2019-06-18 MED ORDER — BUPIVACAINE HCL 0.25 % IJ SOLN
INTRAMUSCULAR | Status: DC | PRN
  Administered 2019-06-18: 30 mL

## 2019-06-18 MED ORDER — ROCURONIUM BROMIDE 10 MG/ML (PF) SYRINGE
PREFILLED_SYRINGE | INTRAVENOUS | Status: DC | PRN
  Administered 2019-06-18: 80 mg via INTRAVENOUS

## 2019-06-18 MED ORDER — MIDAZOLAM HCL 2 MG/2ML IJ SOLN
INTRAMUSCULAR | Status: DC | PRN
  Administered 2019-06-18: 2 mg via INTRAVENOUS

## 2019-06-18 MED ORDER — SUGAMMADEX SODIUM 500 MG/5ML IV SOLN
INTRAVENOUS | Status: AC
  Filled 2019-06-18: qty 5

## 2019-06-18 MED ORDER — SODIUM CHLORIDE 0.9 % IR SOLN
Status: DC | PRN
  Administered 2019-06-18: 1000 mL

## 2019-06-18 MED ORDER — FENTANYL CITRATE (PF) 250 MCG/5ML IJ SOLN
INTRAMUSCULAR | Status: AC
  Filled 2019-06-18: qty 5

## 2019-06-18 MED ORDER — PROPOFOL 10 MG/ML IV BOLUS
INTRAVENOUS | Status: DC | PRN
  Administered 2019-06-18: 130 mg via INTRAVENOUS

## 2019-06-18 MED ORDER — SCOPOLAMINE 1 MG/3DAYS TD PT72
MEDICATED_PATCH | TRANSDERMAL | Status: DC | PRN
  Administered 2019-06-18: 1 via TRANSDERMAL

## 2019-06-18 MED ORDER — ENSURE PRE-SURGERY PO LIQD
296.0000 mL | Freq: Once | ORAL | Status: DC
  Filled 2019-06-18: qty 296

## 2019-06-18 MED ORDER — ACETAMINOPHEN 500 MG PO TABS
1000.0000 mg | ORAL_TABLET | ORAL | Status: AC
  Administered 2019-06-18: 1000 mg via ORAL
  Filled 2019-06-18: qty 2

## 2019-06-18 MED ORDER — ROCURONIUM BROMIDE 10 MG/ML (PF) SYRINGE
PREFILLED_SYRINGE | INTRAVENOUS | Status: AC
  Filled 2019-06-18: qty 10

## 2019-06-18 MED ORDER — LIDOCAINE 2% (20 MG/ML) 5 ML SYRINGE
INTRAMUSCULAR | Status: AC
  Filled 2019-06-18: qty 5

## 2019-06-18 MED ORDER — POTASSIUM CHLORIDE 10 MEQ/100ML IV SOLN
10.0000 meq | INTRAVENOUS | Status: AC
  Administered 2019-06-18 (×3): 10 meq via INTRAVENOUS
  Filled 2019-06-18 (×5): qty 100

## 2019-06-18 MED ORDER — FENTANYL CITRATE (PF) 100 MCG/2ML IJ SOLN
25.0000 ug | INTRAMUSCULAR | Status: DC | PRN
  Administered 2019-06-18 (×2): 25 ug via INTRAVENOUS

## 2019-06-18 MED ORDER — KETOROLAC TROMETHAMINE 15 MG/ML IJ SOLN
15.0000 mg | INTRAMUSCULAR | Status: AC
  Administered 2019-06-18: 15 mg via INTRAVENOUS
  Filled 2019-06-18: qty 1

## 2019-06-18 MED ORDER — ACETAMINOPHEN 500 MG PO TABS: 1000.0000 mg | ORAL_TABLET | Freq: Four times a day (QID) | ORAL | Status: DC

## 2019-06-18 MED ORDER — LIDOCAINE 2% (20 MG/ML) 5 ML SYRINGE
INTRAMUSCULAR | Status: DC | PRN
  Administered 2019-06-18: 60 mg via INTRAVENOUS

## 2019-06-18 MED ORDER — LACTATED RINGERS IV SOLN: INTRAVENOUS | Status: DC | PRN

## 2019-06-18 MED ORDER — GABAPENTIN 300 MG PO CAPS
300.0000 mg | ORAL_CAPSULE | ORAL | Status: AC
  Administered 2019-06-18: 300 mg via ORAL
  Filled 2019-06-18: qty 1

## 2019-06-18 MED ORDER — ONDANSETRON HCL 4 MG/2ML IJ SOLN
INTRAMUSCULAR | Status: AC
  Filled 2019-06-18: qty 2

## 2019-06-18 SURGICAL SUPPLY — 36 items
BLADE CLIPPER SURG (BLADE) ×2 IMPLANT
CANISTER SUCT 3000ML PPV (MISCELLANEOUS) ×3 IMPLANT
CHLORAPREP W/TINT 26 (MISCELLANEOUS) ×3 IMPLANT
CLIP VESOLOCK MED LG 6/CT (CLIP) ×1 IMPLANT
CLIP VESOLOCK XL 6/CT (CLIP) ×4 IMPLANT
COVER SURGICAL LIGHT HANDLE (MISCELLANEOUS) ×3 IMPLANT
COVER WAND RF STERILE (DRAPES) ×3 IMPLANT
DERMABOND ADVANCED (GAUZE/BANDAGES/DRESSINGS) ×2
DERMABOND ADVANCED .7 DNX12 (GAUZE/BANDAGES/DRESSINGS) ×1 IMPLANT
ELECT REM PT RETURN 9FT ADLT (ELECTROSURGICAL) ×3
ELECTRODE REM PT RTRN 9FT ADLT (ELECTROSURGICAL) ×1 IMPLANT
GLOVE BIOGEL PI IND STRL 7.0 (GLOVE) ×1 IMPLANT
GLOVE BIOGEL PI INDICATOR 7.0 (GLOVE) ×2
GLOVE SURG SS PI 7.0 STRL IVOR (GLOVE) ×3 IMPLANT
GOWN STRL REUS W/ TWL LRG LVL3 (GOWN DISPOSABLE) ×3 IMPLANT
GOWN STRL REUS W/TWL LRG LVL3 (GOWN DISPOSABLE) ×6
GRASPER SUT TROCAR 14GX15 (MISCELLANEOUS) ×3 IMPLANT
KIT BASIN OR (CUSTOM PROCEDURE TRAY) ×3 IMPLANT
KIT TURNOVER KIT B (KITS) ×3 IMPLANT
NEEDLE 22X1 1/2 (OR ONLY) (NEEDLE) ×3 IMPLANT
NS IRRIG 1000ML POUR BTL (IV SOLUTION) ×3 IMPLANT
PAD ARMBOARD 7.5X6 YLW CONV (MISCELLANEOUS) ×3 IMPLANT
POUCH RETRIEVAL ECOSAC 10 (ENDOMECHANICALS) ×1 IMPLANT
POUCH RETRIEVAL ECOSAC 10MM (ENDOMECHANICALS) ×2
SCISSORS LAP 5X35 DISP (ENDOMECHANICALS) ×3 IMPLANT
SET IRRIG TUBING LAPAROSCOPIC (IRRIGATION / IRRIGATOR) ×3 IMPLANT
SET TUBE SMOKE EVAC HIGH FLOW (TUBING) ×3 IMPLANT
SLEEVE ENDOPATH XCEL 5M (ENDOMECHANICALS) ×6 IMPLANT
SPECIMEN JAR SMALL (MISCELLANEOUS) ×3 IMPLANT
SUT MNCRL AB 4-0 PS2 18 (SUTURE) ×3 IMPLANT
TOWEL GREEN STERILE (TOWEL DISPOSABLE) ×3 IMPLANT
TOWEL GREEN STERILE FF (TOWEL DISPOSABLE) ×3 IMPLANT
TRAY LAPAROSCOPIC MC (CUSTOM PROCEDURE TRAY) ×3 IMPLANT
TROCAR XCEL 12X100 BLDLESS (ENDOMECHANICALS) ×3 IMPLANT
TROCAR XCEL NON-BLD 5MMX100MML (ENDOMECHANICALS) ×3 IMPLANT
WATER STERILE IRR 1000ML POUR (IV SOLUTION) ×3 IMPLANT

## 2019-06-18 NOTE — Anesthesia Preprocedure Evaluation (Addendum)
Anesthesia Evaluation  Patient identified by MRN, date of birth, ID band Patient awake    Reviewed: Allergy & Precautions, NPO status , Patient's Chart, lab work & pertinent test results, reviewed documented beta blocker date and time   Airway Mallampati: I  TM Distance: >3 FB Neck ROM: Full    Dental no notable dental hx. (+) Teeth Intact, Dental Advisory Given   Pulmonary sleep apnea , Current Smoker and Patient abstained from smoking.,    Pulmonary exam normal breath sounds clear to auscultation       Cardiovascular hypertension, Pt. on home beta blockers and Pt. on medications negative cardio ROS Normal cardiovascular exam Rhythm:Regular Rate:Normal     Neuro/Psych negative neurological ROS  negative psych ROS   GI/Hepatic negative GI ROS, Neg liver ROS,   Endo/Other  Hypothyroidism   Renal/GU negative Renal ROSK 2.6-->2.8. Plan to continue K repletion in OR.   negative genitourinary   Musculoskeletal negative musculoskeletal ROS (+)   Abdominal   Peds  Hematology negative hematology ROS (+)   Anesthesia Other Findings   Reproductive/Obstetrics                            Anesthesia Physical Anesthesia Plan  ASA: III  Anesthesia Plan: General   Post-op Pain Management:    Induction: Intravenous  PONV Risk Score and Plan: 2 and Midazolam, Dexamethasone and Ondansetron  Airway Management Planned: Oral ETT  Additional Equipment:   Intra-op Plan:   Post-operative Plan: Extubation in OR  Informed Consent: I have reviewed the patients History and Physical, chart, labs and discussed the procedure including the risks, benefits and alternatives for the proposed anesthesia with the patient or authorized representative who has indicated his/her understanding and acceptance.     Dental advisory given  Plan Discussed with: CRNA  Anesthesia Plan Comments:         Anesthesia  Quick Evaluation

## 2019-06-18 NOTE — Op Note (Signed)
PATIENT:  Rebekah Stanton  49 y.o. female  PRE-OPERATIVE DIAGNOSIS:  ACUTE CHOLECYSTITIS  POST-OPERATIVE DIAGNOSIS:  ACUTE CHOLECYSTITIS  PROCEDURE:  Procedure(s): LAPAROSCOPIC CHOLECYSTECTOMY   SURGEON:  Surgeon(s): Tramaine Sauls, De Blanch, MD  ASSISTANT: Leary Roca, M.D.  ANESTHESIA:   local and general  Indications for procedure: [name] is a 49 y.o. female with symptoms of Abdominal pain and Nausea and vomiting consistent with gallbladder disease, Confirmed by Ultrasound.  Description of procedure: The patient was brought into the operative suite, placed supine. Anesthesia was administered with endotracheal tube. Patient was strapped in place and foot board was secured. All pressure points were offloaded by foam padding. The patient was prepped and draped in the usual sterile fashion.  A small incision was made to the right of the umbilicus. A 21mm trocar was inserted into the peritoneal cavity with optical entry. Pneumoperitoneum was applied with high flow low pressure. 2 42mm trocars were placed in the RUQ. A 65mm trocar was placed in the subxiphoid space. Marcaine was infused to the subxiphoid space and lateral upper right abdomen in the transversus abdominis plane. Next the patient was placed in reverse trendelenberg. The gallbladder showed signs of acute inflammation and was distended.  The gallbladder was retracted cephalad and lateral. The peritoneum was reflected off the infundibulum working lateral to medial. The cystic duct and cystic artery were identified and further dissection revealed a critical view. The cystic duct and cystic artery were doubly clipped and ligated.   The gallbladder was removed off the liver bed with cautery. The Gallbladder was placed in a specimen bag. The gallbladder fossa was irrigated and hemostasis was applied with cautery. The gallbladder was removed via the 66mm trocar. The fascial defect was closed with interrupted 0 vicryl suture via  laparoscopic trans-fascial suture passer. Pneumoperitoneum was removed, all trocar were removed. All incisions were closed with 4-0 monocryl subcuticular stitch. The patient woke from anesthesia and was brought to PACU in stable condition. All counts were correct  Findings: acute calculous cholecystitis  Specimen: gallbladder  Blood loss: 20 ml  Local anesthesia:  30 ml marcaine  Complications: none  PLAN OF CARE: return to floor with plan to discharge today  PATIENT DISPOSITION:  PACU - hemodynamically stable.  Images:          Feliciana Rossetti, M.D. General, Bariatric, & Minimally Invasive Surgery [facility] [place]

## 2019-06-18 NOTE — Anesthesia Procedure Notes (Signed)
Procedure Name: Intubation Date/Time: 06/18/2019 2:57 PM Performed by: Leonor Liv, CRNA Pre-anesthesia Checklist: Patient identified, Emergency Drugs available, Suction available and Patient being monitored Patient Re-evaluated:Patient Re-evaluated prior to induction Oxygen Delivery Method: Circle System Utilized Preoxygenation: Pre-oxygenation with 100% oxygen Induction Type: IV induction Ventilation: Mask ventilation without difficulty Laryngoscope Size: Mac and 3 Grade View: Grade II Tube type: Oral Tube size: 7.0 mm Number of attempts: 1 Airway Equipment and Method: Stylet and Oral airway Placement Confirmation: ETT inserted through vocal cords under direct vision,  positive ETCO2 and breath sounds checked- equal and bilateral Secured at: 21 cm Tube secured with: Tape Dental Injury: Teeth and Oropharynx as per pre-operative assessment

## 2019-06-18 NOTE — Plan of Care (Signed)

## 2019-06-18 NOTE — Progress Notes (Addendum)
1300 Pt is A&O x4. NPO post midnight maint. Consent for surgery signed. Pt to short stay with her spouse present. 1800 Received pt from PACU, A&O x4. Lap sites to abd with skin glue dry and intact. Pt wants to walk to the bathroom right after she came back from PACU. Voided w/o difficulty. Steady gait, no dizziness. Tolerated liquids. Pt ordered for food. Pt wants to go home tonight.

## 2019-06-18 NOTE — Progress Notes (Signed)
Patient left the unit with her husband and nurse tech took patient to car via wheel chair.  Patient received Morphine 2 mg IV for her 2 hour drive home.  She received discharge information.  She left the unit alert and oriented x 4 and she was grateful to be able to go home this evening.

## 2019-06-18 NOTE — Progress Notes (Signed)
Pre Procedure note for inpatients:   Rebekah Stanton has been scheduled for Procedure(s): LAPAROSCOPIC CHOLECYSTECTOMY (N/A) today. The various methods of treatment have been discussed with the patient. After consideration of the risks, benefits and treatment options the patient has consented to the planned procedure.   The patient has been seen and labs reviewed. There are no changes in the patient's condition to prevent proceeding with the planned procedure today.  Recent labs:  Lab Results  Component Value Date   WBC 7.1 06/18/2019   HGB 13.0 06/18/2019   HCT 38.4 06/18/2019   PLT 231 06/18/2019   GLUCOSE 120 (H) 06/18/2019   ALT 24 06/18/2019   AST 12 (L) 06/18/2019   NA 143 06/18/2019   K 2.8 (L) 06/18/2019   CL 106 06/18/2019   CREATININE 0.86 06/18/2019   BUN 6 06/18/2019   CO2 29 06/18/2019    Rodman Pickle, MD 06/18/2019 2:38 PM

## 2019-06-18 NOTE — Transfer of Care (Signed)
Immediate Anesthesia Transfer of Care Note  Patient: Rebekah Stanton  Procedure(s) Performed: LAPAROSCOPIC CHOLECYSTECTOMY (N/A Abdomen)  Patient Location: PACU  Anesthesia Type:General  Level of Consciousness: drowsy and patient cooperative  Airway & Oxygen Therapy: Patient Spontanous Breathing and Patient connected to nasal cannula oxygen  Post-op Assessment: Report given to RN, Post -op Vital signs reviewed and stable and Patient moving all extremities  Post vital signs: Reviewed and stable  Last Vitals:  Vitals Value Taken Time  BP 111/77 06/18/19 1601  Temp    Pulse 63 06/18/19 1603  Resp 21 06/18/19 1603  SpO2 97 % 06/18/19 1603  Vitals shown include unvalidated device data.  Last Pain:  Vitals:   06/18/19 0800  TempSrc:   PainSc: 0-No pain      Patients Stated Pain Goal: 1 (06/17/19 1841)  Complications: No apparent anesthesia complications

## 2019-06-18 NOTE — Discharge Instructions (Signed)
[facility]  Please arrive at least 30 min before your appointment to complete your check in paperwork.  If you are unable to arrive 30 min prior to your appointment time we may have to cancel or reschedule you. LAPAROSCOPIC SURGERY: POST OP INSTRUCTIONS Always review your discharge instruction sheet given to you by the facility where your surgery was performed. IF YOU HAVE DISABILITY OR FAMILY LEAVE FORMS, YOU MUST BRING THEM TO THE OFFICE FOR PROCESSING.   DO NOT GIVE THEM TO YOUR DOCTOR.  PAIN CONTROL  1. First take acetaminophen (Tylenol) AND/or ibuprofen (Advil) to control your pain after surgery.  Follow directions on package.  Taking acetaminophen (Tylenol) and/or ibuprofen (Advil) regularly after surgery will help to control your pain and lower the amount of prescription pain medication you may need.  You should not take more than 4,000 mg (4 grams) of acetaminophen (Tylenol) in 24 hours.  You should not take ibuprofen (Advil), aleve, motrin, naprosyn or other NSAIDS if you have a history of stomach ulcers or chronic kidney disease.  2. A prescription for pain medication may be given to you upon discharge.  Take your pain medication as prescribed, if you still have uncontrolled pain after taking acetaminophen (Tylenol) or ibuprofen (Advil). 3. Use ice packs to help control pain. 4. If you need a refill on your pain medication, please contact your pharmacy.  They will contact our office to request authorization. Prescriptions will not be filled after 5pm or on week-ends.  HOME MEDICATIONS 5. Take your usually prescribed medications unless otherwise directed.  DIET 6. You should follow a light diet the first few days after arrival home.  Be sure to include lots of fluids daily. Avoid fatty, fried foods.   CONSTIPATION 7. It is common to experience some constipation after surgery and if you are taking pain medication.  Increasing fluid intake and taking a stool  softener (such as Colace) will usually help or prevent this problem from occurring.  A mild laxative (Milk of Magnesia or Miralax) should be taken according to package instructions if there are no bowel movements after 48 hours.  WOUND/INCISION CARE 8. Most patients will experience some swelling and bruising in the area of the incisions.  Ice packs will help.  Swelling and bruising can take several days to resolve.  9. Unless discharge instructions indicate otherwise, follow guidelines below  a. STERI-STRIPS - you may remove your outer bandages 48 hours after surgery, and you may shower at that time.  You have steri-strips (small skin tapes) in place directly over the incision.  These strips should be left on the skin for 7-10 days.   b. DERMABOND/SKIN GLUE - you may shower in 24 hours.  The glue will flake off over the next 2-3 weeks. 10. Any sutures or staples will be removed at the office during your follow-up visit.  ACTIVITIES 11. You may resume regular (light) daily activities beginning the next day--such as daily self-care, walking, climbing stairs--gradually increasing activities as tolerated.  You may have sexual intercourse when it is comfortable.  Refrain from any heavy lifting or straining until approved by your doctor. a. You may drive when you are no longer taking prescription pain medication, you can comfortably wear a seatbelt, and you can safely maneuver your car and apply brakes.  FOLLOW-UP 12. You should see your doctor in the office for a follow-up appointment approximately 2-3 weeks after your surgery.  You should have been given your post-op/follow-up appointment when   your surgery was scheduled.  If you did not receive a post-op/follow-up appointment, make sure that you call for this appointment within a day or two after you arrive home to insure a convenient appointment time.   WHEN TO CALL YOUR DOCTOR: 1. Fever over 101.0 2. Inability to urinate 3. Continued bleeding from  incision. 4. Increased pain, redness, or drainage from the incision. 5. Increasing abdominal pain  The clinic staff is available to answer your questions during regular business hours.  Please don't hesitate to call and ask to speak to one of the nurses for clinical concerns.  If you have a medical emergency, go to the nearest emergency room or call 911.  A surgeon from [facility] is always on call at the hospital. [address], [address], [place], [place]  [address] ? [address] [address], [place], [place]   [address] [phone] ? [phone] ? FAX [phone]  .........   Managing Your Pain After Surgery Without Opioids    Thank you for participating in our program to help patients manage their pain after surgery without opioids. This is part of our effort to provide you with the best care possible, without exposing you or your family to the risk that opioids pose.  What pain can I expect after surgery? You can expect to have some pain after surgery. This is normal. The pain is typically worse the day after surgery, and quickly begins to get better. Many studies have found that many patients are able to manage their pain after surgery with Over-the-Counter (OTC) medications such as Tylenol and Motrin. If you have a condition that does not allow you to take Tylenol or Motrin, notify your surgical team.  How will I manage my pain? The best strategy for controlling your pain after surgery is around the clock pain control with Tylenol (acetaminophen) and Motrin (ibuprofen or Advil). Alternating these medications with each other allows you to maximize your pain control. In addition to Tylenol and Motrin, you can use heating pads or ice packs on your incisions to help reduce your pain.  How will I alternate your regular strength over-the-counter pain medication? You will take a dose of pain medication every three hours. ; Start by taking 650 mg of Tylenol (2 pills of 325  mg) ; 3 hours later take 600 mg of Motrin (3 pills of 200 mg) ; 3 hours after taking the Motrin take 650 mg of Tylenol ; 3 hours after that take 600 mg of Motrin.   - 1 -  See example - if your first dose of Tylenol is at 12:00 PM   12:00 PM Tylenol 650 mg (2 pills of 325 mg)  3:00 PM Motrin 600 mg (3 pills of 200 mg)  6:00 PM Tylenol 650 mg (2 pills of 325 mg)  9:00 PM Motrin 600 mg (3 pills of 200 mg)  Continue alternating every 3 hours   We recommend that you follow this schedule around-the-clock for at least 3 days after surgery, or until you feel that it is no longer needed. Use the table on the last page of this handout to keep track of the medications you are taking. Important: Do not take more than 3000mg of Tylenol or 3200mg of Motrin in a 24-hour period. Do not take ibuprofen/Motrin if you have a history of bleeding stomach ulcers, severe kidney disease, &/or actively taking a blood thinner  What if I still have pain? If you have pain that is not   controlled with the over-the-counter pain medications (Tylenol and Motrin or Advil) you might have what we call "breakthrough" pain. You will receive a prescription for a small amount of an opioid pain medication such as Oxycodone, Tramadol, or Tylenol with Codeine. Use these opioid pills in the first 24 hours after surgery if you have breakthrough pain. Do not take more than 1 pill every 4-6 hours.  If you still have uncontrolled pain after using all opioid pills, don't hesitate to call our staff using the number provided. We will help make sure you are managing your pain in the best way possible, and if necessary, we can provide a prescription for additional pain medication.   Day 1    Time  Name of Medication Number of pills taken  Amount of Acetaminophen  Pain Level   Comments  AM PM       AM PM       AM PM       AM PM       AM PM       AM PM       AM PM       AM PM       Total Daily amount of Acetaminophen Do not  take more than  3,000 mg per day      Day 2    Time  Name of Medication Number of pills taken  Amount of Acetaminophen  Pain Level   Comments  AM PM       AM PM       AM PM       AM PM       AM PM       AM PM       AM PM       AM PM       Total Daily amount of Acetaminophen Do not take more than  3,000 mg per day      Day 3    Time  Name of Medication Number of pills taken  Amount of Acetaminophen  Pain Level   Comments  AM PM       AM PM       AM PM       AM PM          AM PM       AM PM       AM PM       AM PM       Total Daily amount of Acetaminophen Do not take more than  3,000 mg per day      Day 4    Time  Name of Medication Number of pills taken  Amount of Acetaminophen  Pain Level   Comments  AM PM       AM PM       AM PM       AM PM       AM PM       AM PM       AM PM       AM PM       Total Daily amount of Acetaminophen Do not take more than  3,000 mg per day      Day 5    Time  Name of Medication Number of pills taken  Amount of Acetaminophen  Pain Level   Comments  AM PM       AM PM       AM   PM       AM PM       AM PM       AM PM       AM PM       AM PM       Total Daily amount of Acetaminophen Do not take more than  3,000 mg per day       Day 6    Time  Name of Medication Number of pills taken  Amount of Acetaminophen  Pain Level  Comments  AM PM       AM PM       AM PM       AM PM       AM PM       AM PM       AM PM       AM PM       Total Daily amount of Acetaminophen Do not take more than  3,000 mg per day      Day 7    Time  Name of Medication Number of pills taken  Amount of Acetaminophen  Pain Level   Comments  AM PM       AM PM       AM PM       AM PM       AM PM       AM PM       AM PM       AM PM       Total Daily amount of Acetaminophen Do not take more than  3,000 mg per day        For additional information about how and where to safely dispose of unused  opioid medications - https://www.morepowerfulnc.org  Disclaimer: This document contains information and/or instructional materials adapted from [facility] for the typical patient with your condition. It does not replace medical advice from your health care provider because your experience may differ from that of the typical patient. Talk to your health care provider if you have any questions about this document, your condition or your treatment plan. Adapted from [facility]   

## 2019-06-18 NOTE — Progress Notes (Signed)
Patient has been up to the bathroom, ambulating in the room.  She had dinner.  She is doing very well and is ready for discharge.  Call on call MD for [facility] to have AVS filled out in Epic.  Awaiting call back.  Patient really wants to be discharged tonight before 10 pm.  Spouse is here and ready to take her home.  On call MD finished AVS.  Patient is ready for discharge.

## 2019-06-19 ENCOUNTER — Encounter: Payer: Self-pay | Admitting: *Deleted

## 2019-06-19 LAB — SURGICAL PATHOLOGY

## 2019-06-19 NOTE — Anesthesia Postprocedure Evaluation (Signed)
Anesthesia Post Note  Patient: Rebekah Stanton  Procedure(s) Performed: LAPAROSCOPIC CHOLECYSTECTOMY (N/A Abdomen)     Patient location during evaluation: PACU Anesthesia Type: General Level of consciousness: awake and alert and oriented Pain management: pain level controlled Vital Signs Assessment: post-procedure vital signs reviewed and stable Respiratory status: spontaneous breathing, nonlabored ventilation and respiratory function stable Cardiovascular status: blood pressure returned to baseline Postop Assessment: no apparent nausea or vomiting Anesthetic complications: no                  Kaylyn Layer

## 2019-06-22 NOTE — Discharge Summary (Signed)
Patient ID: Rebekah Stanton [id number] [birth year] 49 y.o.  Admit date: 06/17/2019 Discharge date: 06/22/2019  Admitting Diagnosis: cholecystitis  Discharge Diagnosis Patient Active Problem List   Diagnosis Date Noted  . Symptomatic cholelithiasis 06/17/2019    Consultants none  Reason for Admission: Rebekah Stanton is a 49 y.o. female with a hx of OSA, HTN and hypothyroidism who presented to the emergency room for epigastric abdominal pain with nausea.  Patient reports that around 10 PM last night after finishing a "Mother's Day cookout" she began having severe, 10/10, burning epigastric abdominal pain that radiated to her mid back.  She reports her symptoms were associated with nausea as well as 1 episode of diarrhea.  No associated fever, chills, emesis, chest pain or shortness of breath.  She did not try anything for this at home.  She notes 2 similar episodes in the past 1 week that onset after eating foods such as BBQ. Her previous episodes were self limiting and lasted for <1 hour.   She presented to the emergency room for evaluation.  She is known to be afebrile without tachycardia, tachypnea, hypoxia or hypotension.  WBC 9.8.  LFTs within normal limits. RUQ US showed large gallstone at neck of the gallbladder measuring 1.4cm. There was no gallbladder wall thickening or pericholecystic fluid.  Despite IV pain medication, patient continued to have pain.  We were asked to evaluate.    Patient is not on blood thinners.  She notes that she has had 2 C-sections, abdominal hysterectomy as well as a tummy tuck.  She reports occasional alcohol use.  No tobacco use.  No illicit drug use.  She is a [employer].  Procedures Lap chole, Dr. Sheliah Hatch, 06/18/19  Hospital Course:  The patient was admitted and underwent a laparoscopic cholecystectomy.  The patient tolerated the procedure well.  On POD 0, the patient was tolerating a regular diet, voiding well, mobilizing, and pain  was controlled with oral pain medications.  The patient was stable for DC home at this time with appropriate follow up made.   I did not participate in this patient's care during her hospital stay.  Note written from chart after her discharge.  Allergies as of 06/18/2019      Reactions   Penicillins Hives      Medication List    TAKE these medications   amLODipine 5 MG tablet Commonly known as: NORVASC Take 5 mg by mouth daily.   clobetasol 0.05 % topical foam Commonly known as: OLUX Apply 1 application topically daily.   ibuprofen 200 MG tablet Commonly known as: ADVIL Take 400 mg by mouth daily as needed for fever or moderate pain.   levothyroxine 125 MCG tablet Commonly known as: SYNTHROID Take 125 mcg by mouth daily.   lisinopril-hydrochlorothiazide 20-12.5 MG tablet Commonly known as: ZESTORETIC Take 2 tablets by mouth daily.   metoprolol succinate 25 MG 24 hr tablet Commonly known as: TOPROL-XL Take 25 mg by mouth daily.   sertraline 50 MG tablet Commonly known as: ZOLOFT Take 50 mg by mouth daily.        Follow-up Information    [facility], [facility]. Go on 07/10/2019.   Specialty: General Surgery Why: 845am. Please arrive 30 minutes prior to your appointment for paperwork. Please bring a copy of your photo ID and insurance card Contact information: [address] [place] [address] [phone]           Signed: Barnetta Chapel, [facility]  [facility]gery 06/22/2019, 3:35 PM Please see Amion for pager number during day hours 7:00am-4:30pm, 7-11:30am on Weekends

## 2022-02-25 IMAGING — US US ABDOMEN LIMITED
1 series · 14 of 25 positions shown · non-contrast
Comparison: None.

CLINICAL DATA: RIGHT lower quadrant pain and abdominal pain

EXAM:
ULTRASOUND ABDOMEN LIMITED RIGHT UPPER QUADRANT

[Series 1: us abdomen limited ruq · 14 of 50 slices shown]
[im 1/50]
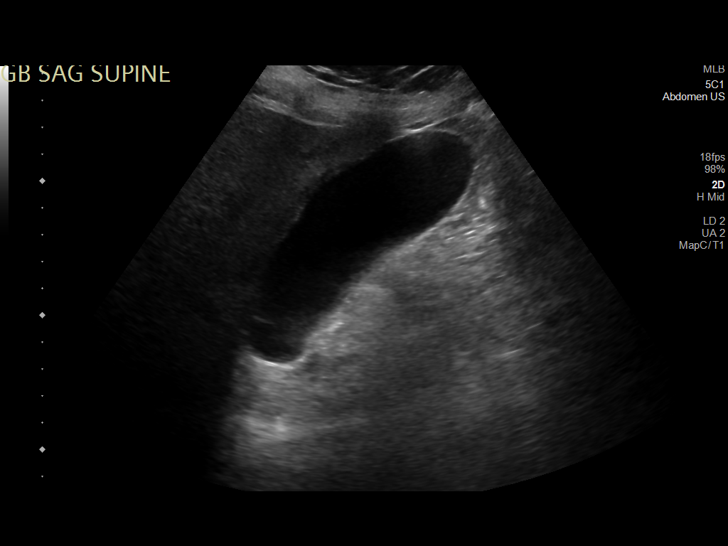
[im 5/50]
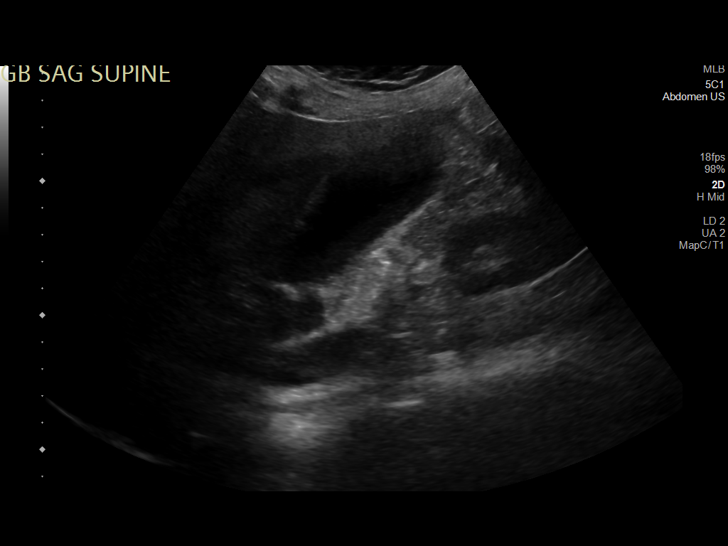
[im 9/50]
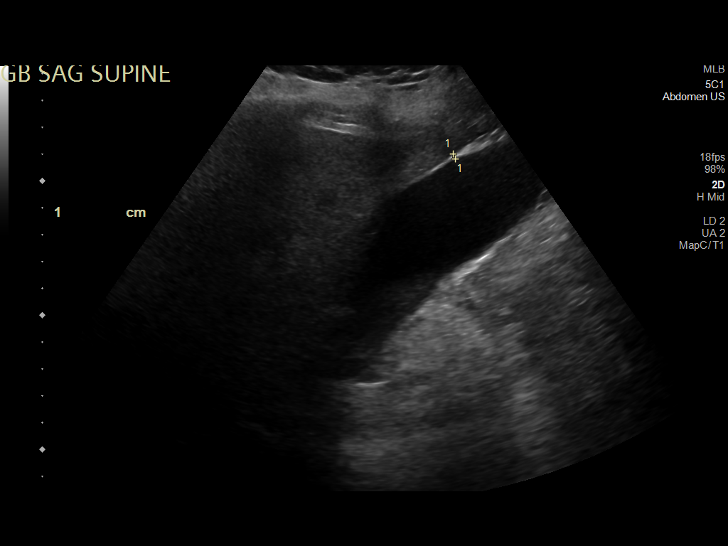
[im 13/50]
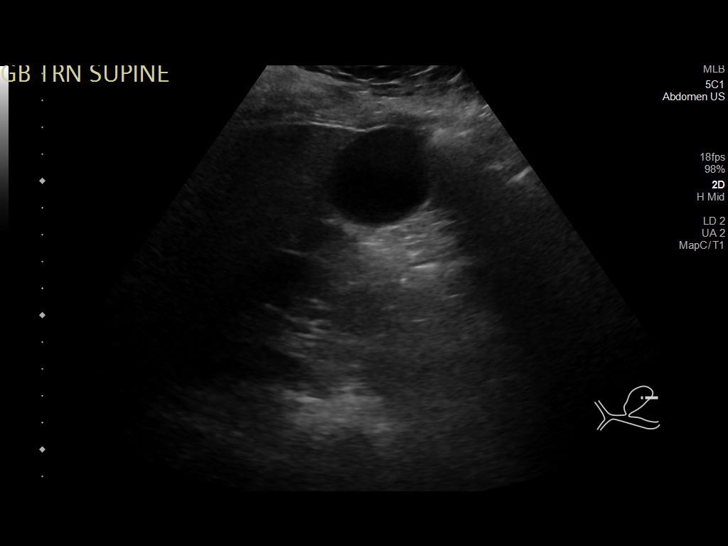
[im 17/50]
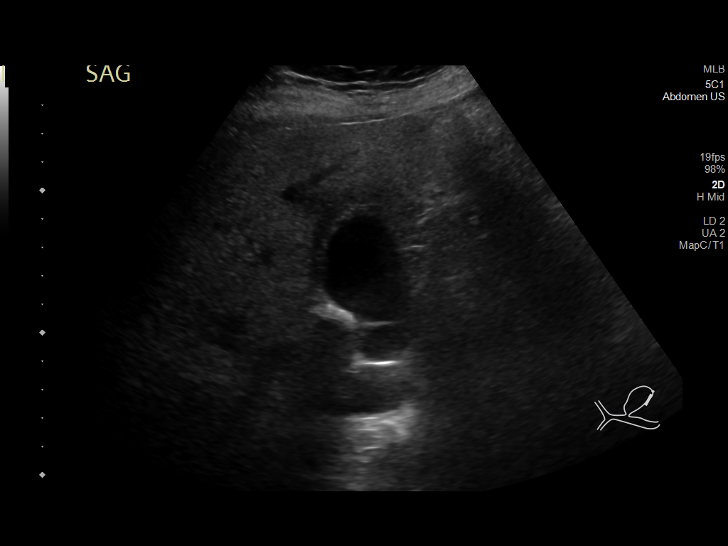
[im 19/50]
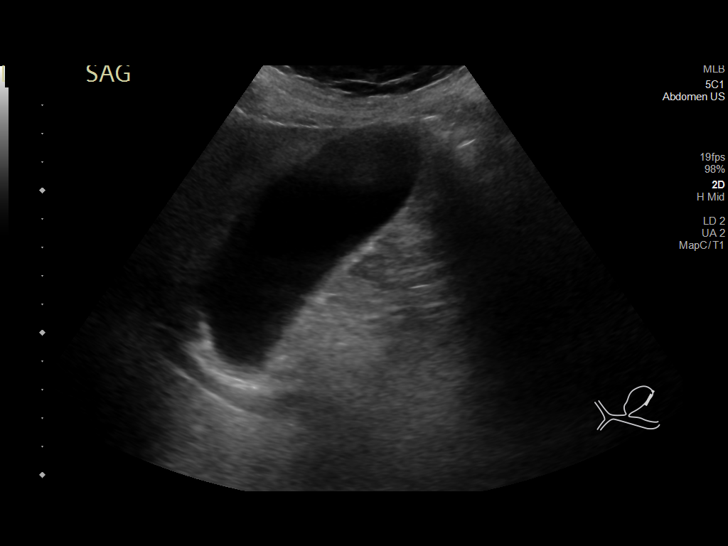
[im 23/50]
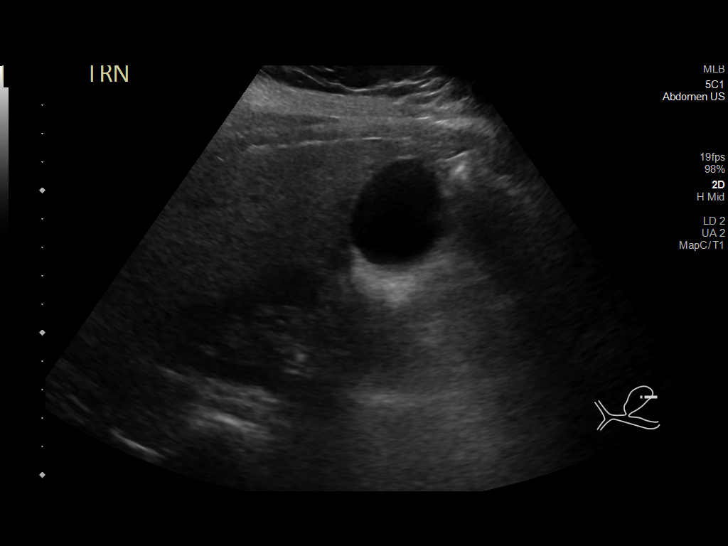
[im 27/50]
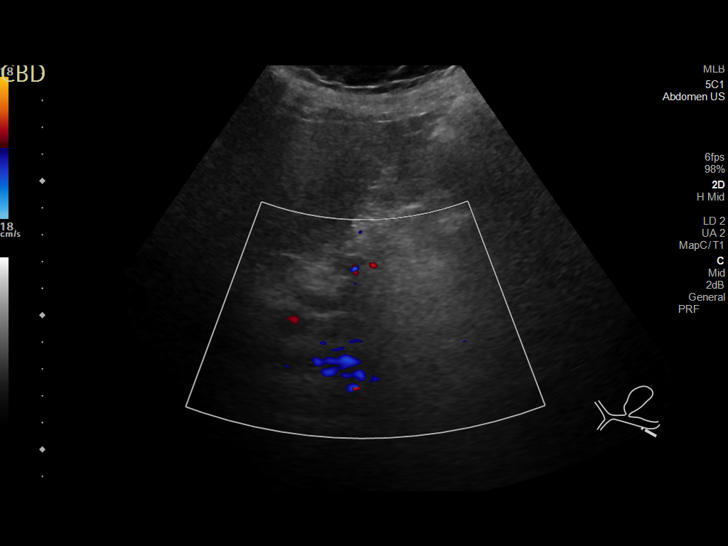
[im 31/50]
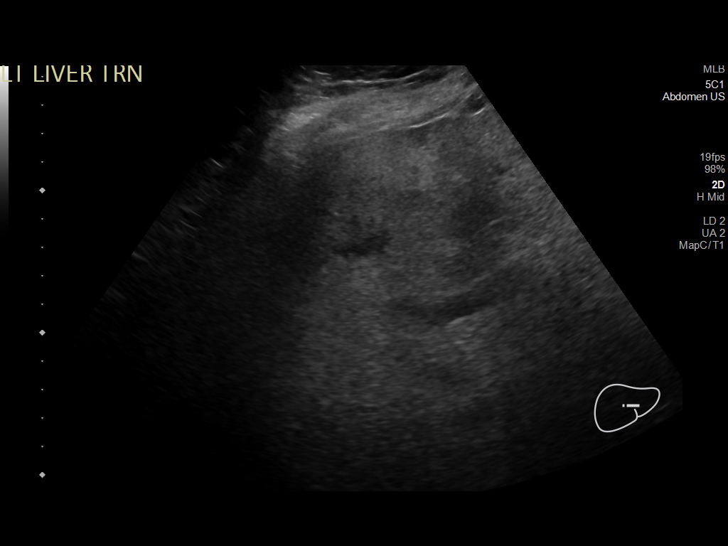
[im 33/50]
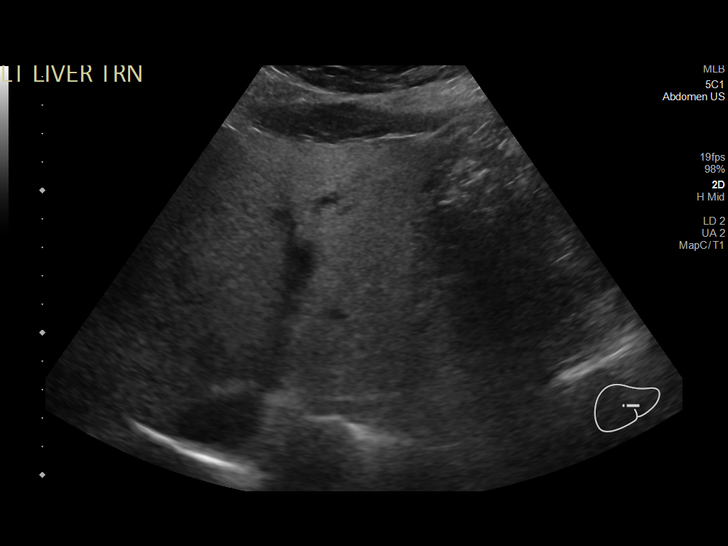
[im 37/50]
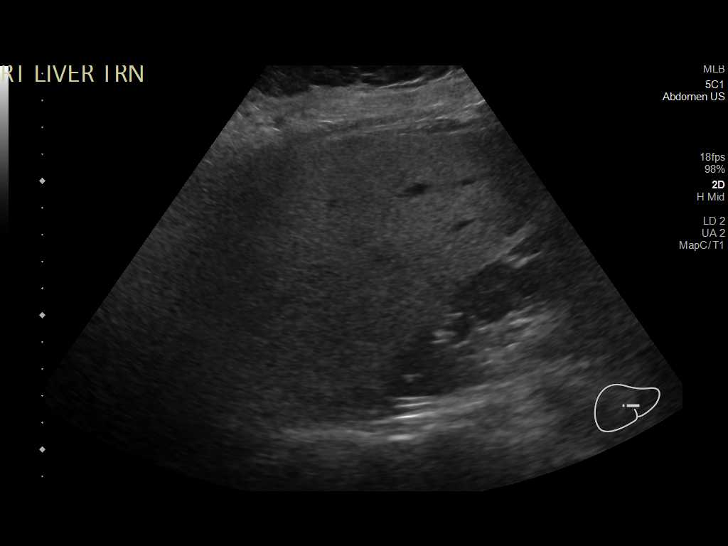
[im 41/50]
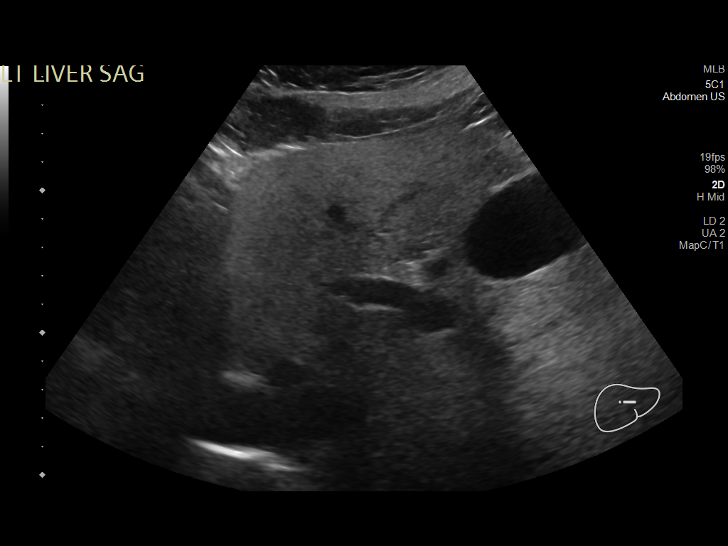
[im 45/50]
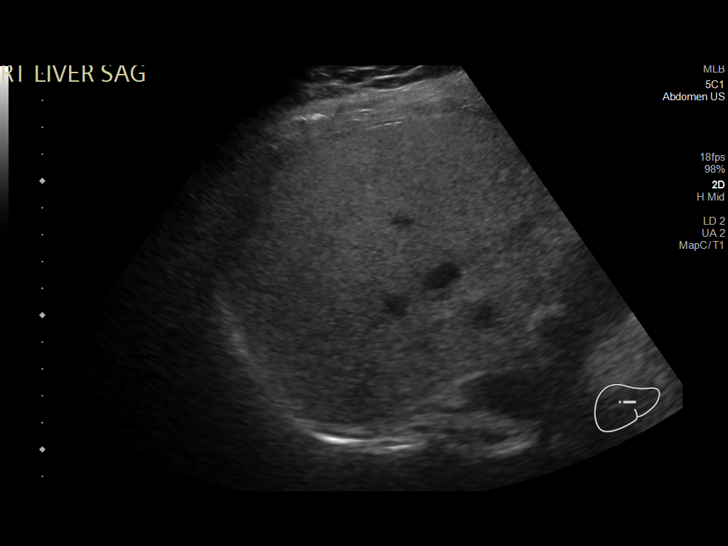
[im 50/50]
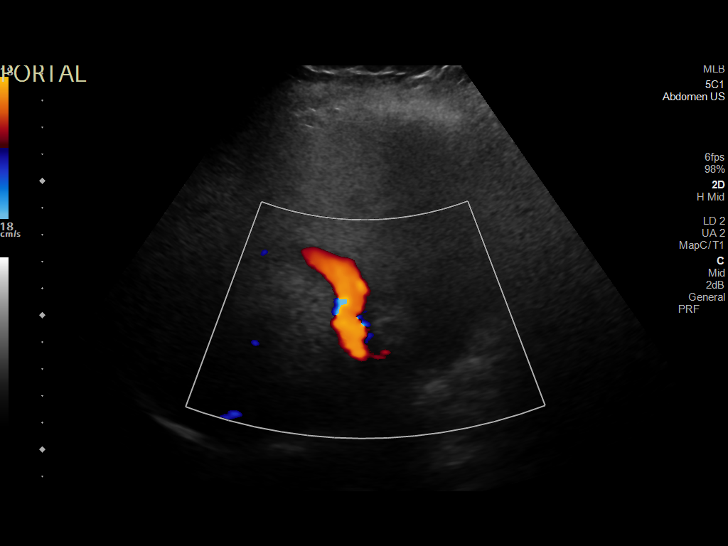

[14 of 25 positions shown; findings below may reference images not displayed]

FINDINGS: Gallbladder:

Gallbladder is nondistended. No gallbladder wall thickening or
pericholecystic fluid. Negative sonographic Murphy's sign.

There is a calculus with towards the neck of the gallbladder
measuring 1.4 cm.

Common bile duct:

Diameter: Upper limits of normal at 6 mm.

Liver:

Increased hepatic echogenicity. Portal vein is patent on color
Doppler imaging with normal direction of blood flow towards the
liver.

Other: None.
IMPRESSION: 1. Large gallstone towards the neck of the gallbladder without
evidence acute cholecystitis.
2. Common bile duct upper limits of normal.
3. Heterogeneous increased liver echogenicity commonly represents
hepatic steatosis.
